# Patient Record
Sex: Male | Born: 1985 | Hispanic: No | Marital: Married | Smoking: Never smoker
Health system: Southern US, Community
[De-identification: ages and names within clinical notes are randomized; demographics above are authoritative.]

## PROBLEM LIST (undated history)

## (undated) DIAGNOSIS — R1032 Left lower quadrant pain: Secondary | ICD-10-CM

## (undated) DIAGNOSIS — R918 Other nonspecific abnormal finding of lung field: Secondary | ICD-10-CM

## (undated) DIAGNOSIS — N5089 Other specified disorders of the male genital organs: Secondary | ICD-10-CM

## (undated) DIAGNOSIS — C7989 Secondary malignant neoplasm of other specified sites: Secondary | ICD-10-CM

---

## 2015-03-12 ENCOUNTER — Encounter (HOSPITAL_COMMUNITY): Payer: Self-pay | Admitting: Emergency Medicine

## 2015-03-12 ENCOUNTER — Emergency Department (INDEPENDENT_AMBULATORY_CARE_PROVIDER_SITE_OTHER): Payer: Self-pay

## 2015-03-12 ENCOUNTER — Emergency Department (INDEPENDENT_AMBULATORY_CARE_PROVIDER_SITE_OTHER)
Admission: EM | Admit: 2015-03-12 | Discharge: 2015-03-12 | Disposition: A | Discharge status: Home / Self Care | Payer: Self-pay | Source: Home / Self Care | Attending: Emergency Medicine | Admitting: Emergency Medicine

## 2015-03-12 DIAGNOSIS — R112 Nausea with vomiting, unspecified: Secondary | ICD-10-CM

## 2015-03-12 DIAGNOSIS — M5442 Lumbago with sciatica, left side: Secondary | ICD-10-CM

## 2015-03-12 DIAGNOSIS — R1084 Generalized abdominal pain: Secondary | ICD-10-CM

## 2015-03-12 DIAGNOSIS — R918 Other nonspecific abnormal finding of lung field: Secondary | ICD-10-CM

## 2015-03-12 LAB — GLUCOSE, CAPILLARY: Glucose-Capillary: 94 mg/dL (ref 65–99)

## 2015-03-12 MED ORDER — ONDANSETRON HCL 4 MG PO TABS: 4.0000 mg | ORAL_TABLET | Freq: Three times a day (TID) | ORAL | Status: DC | PRN

## 2015-03-12 MED ORDER — CIPROFLOXACIN HCL 500 MG PO TABS: 500.0000 mg | ORAL_TABLET | Freq: Two times a day (BID) | ORAL | Status: DC

## 2015-03-12 MED ORDER — PREDNISONE 50 MG PO TABS: ORAL_TABLET | ORAL | Status: DC

## 2015-03-12 MED ORDER — METRONIDAZOLE 500 MG PO TABS: 500.0000 mg | ORAL_TABLET | Freq: Two times a day (BID) | ORAL | Status: DC

## 2015-03-12 NOTE — ED Notes (Signed)
Pt states he has been suffering from abdominal pain and N&V for about 1 week.  He denies any fever.  He reports his last BM was today, it was formed but it wasn't very much.  He also says he has back pain related to his sciatica.  He had same pain four years ago and that is what he was diagnosed with.

## 2015-03-12 NOTE — ED Provider Notes (Signed)
CSN: [id number]     Arrival date & time 03/12/15  1520 History   First MD Initiated Contact with Patient 03/12/15 1544     Chief Complaint  Patient presents with  . Abdominal Pain  . Back Pain  . Nausea  . Emesis   (Consider location/radiation/quality/duration/timing/severity/associated sxs/prior Treatment) HPI  He is a 29 year old man here for evaluation of abdominal pain. He states for the last 2 weeks he has had intermittent abdominal pain, mostly on the left side. It is associated with some nausea, particularly after eating. He has had intermittent vomiting. He has seen a small amount of blood in his vomit. He also reports intermittent diarrhea over the last 2 weeks. No blood in the stool. He denies any foreign travel or questionable food or water sources.  No fevers or chills. He also reports pain in his left lower back with radiation down the left leg. No numbness, tingling, weakness. No bowel or bladder incontinence. He does report a history of sciatica in the left side.  History reviewed. No pertinent past medical history. History reviewed. No pertinent past surgical history. Family History  Problem Relation Age of Onset  . Diabetes Father    Social History  Substance Use Topics  . Smoking status: Never Smoker   . Smokeless tobacco: Never Used  . Alcohol Use: No    Review of Systems As in history of present illness Allergies  Review of patient's allergies indicates no known allergies.  Home Medications   Prior to Admission medications   Medication Sig Start Date End Date Taking? Authorizing Provider  ciprofloxacin (CIPRO) 500 MG tablet Take 1 tablet (500 mg total) by mouth 2 (two) times daily. 03/12/15   Melony Overly, MD  metroNIDAZOLE (FLAGYL) 500 MG tablet Take 1 tablet (500 mg total) by mouth 2 (two) times daily. 03/12/15   Melony Overly, MD  ondansetron (ZOFRAN) 4 MG tablet Take 1 tablet (4 mg total) by mouth every 8 (eight) hours as needed for nausea or vomiting. 03/12/15    Melony Overly, MD  predniSONE (DELTASONE) 50 MG tablet Take 1 pill daily for 5 days. 03/12/15   Melony Overly, MD   Meds Ordered and Administered this Visit  Medications - No data to display  BP 147/73 mmHg  Pulse 85  Temp(Src) 97.6 F (36.4 C) (Oral)  Resp 18  SpO2 98% No data found.   Physical Exam  Constitutional: He appears well-developed and well-nourished. No distress.  Neck: Neck supple.  Cardiovascular: Normal rate, regular rhythm and normal heart sounds.   Pulmonary/Chest: Effort normal and breath sounds normal. No respiratory distress. He has no wheezes. He has no rales.  Abdominal: Soft. Bowel sounds are normal. He exhibits no distension and no mass. There is no tenderness. There is no rebound and no guarding.  Musculoskeletal:  Back: No erythema or edema. No vertebral tenderness or step-offs. No point tenderness, but he points to the left SI area as source of pain. Positive straight leg raise on the left. 5 out of 5 strength in bilateral lower extremities.    ED Course  Procedures (including critical care time)  Labs Review Labs Reviewed  GLUCOSE, CAPILLARY    Imaging Review Dg Abd Acute W/chest  03/12/2015   CLINICAL DATA:  Left lower quadrant abdominal pain.  EXAM: DG ABDOMEN ACUTE W/ 1V CHEST  COMPARISON:  None.  FINDINGS: There is no evidence of dilated bowel loops or free intraperitoneal air. No radiopaque calculi or other significant  radiographic abnormality is seen. Heart size and mediastinal contours are within normal limits. No pneumothorax or pleural effusion is noted. Left lung is clear. Rounded nodules are noted peripherally in the right lung concerning for possible neoplasm or metastatic disease.  IMPRESSION: No evidence of bowel obstruction or ileus. Rounded nodules are noted laterally in the right lung concerning for possible malignancy or metastatic disease. CT scan of the chest is recommended for further evaluation.   Electronically Signed   By: Marijo Conception, M.D.   On: 03/12/2015 17:27      MDM   1. Generalized abdominal pain   2. Non-intractable vomiting with nausea, vomiting of unspecified type   3. Left-sided low back pain with left-sided sciatica   4. Lung nodules    Abdominal exam is benign. This is likely an infectious process. Will treat with Cipro and Flagyl. Zofran as needed for nausea. Back pain is consistent with sciatica. This is likely due to vomiting from the GI process. Treat with 5 days of prednisone. Chest x-ray showed 2 nodules in the right lung. Patient has no smoking history, no personal or family history of smoking. He has no night sweats, fevers, cough, shortness of breath.  Discussed options at length with the patient including transfer to the emergency room for CT scan for additional characterization of the lesions.  Given that he has no risk factors and no insurance, I offered an alternative of a repeat chest x-ray in 3-4 months.  He will return here in 3-4 months for repeat x-ray. If he develops any respiratory symptoms, weight loss, fever he will go to the emergency room for immediate evaluation.    Melony Overly, MD 03/12/15 [phone]

## 2015-03-12 NOTE — Discharge Instructions (Signed)
You likely have an infection in your intestines. Take Cipro and Flagyl twice a day for the next 10 days.  Take prednisone daily for the next 5 days for the back pain.  You should start to feel better over the weekend. If you are not getting better, please come back.  Your x-ray today showed 2 nodules in your right lung.  We discussed options. Please come back here in 3-4 months to repeat the x-ray. If you start developing weight loss, cough, shortness of breath, please go to the emergency room.

## 2015-03-16 ENCOUNTER — Encounter (HOSPITAL_COMMUNITY): Payer: Self-pay | Admitting: Emergency Medicine

## 2015-03-16 ENCOUNTER — Emergency Department (HOSPITAL_COMMUNITY)
Admission: EM | Admit: 2015-03-16 | Discharge: 2015-03-16 | Discharge status: Against Medical Advice | Payer: Self-pay | Attending: Emergency Medicine | Admitting: Emergency Medicine

## 2015-03-16 ENCOUNTER — Emergency Department (INDEPENDENT_AMBULATORY_CARE_PROVIDER_SITE_OTHER)
Admission: EM | Admit: 2015-03-16 | Discharge: 2015-03-16 | Disposition: A | Discharge status: Nursing Home (Includes ICF) | Payer: Self-pay | Source: Home / Self Care | Attending: Family Medicine | Admitting: Family Medicine

## 2015-03-16 DIAGNOSIS — K659 Peritonitis, unspecified: Secondary | ICD-10-CM

## 2015-03-16 DIAGNOSIS — R1012 Left upper quadrant pain: Secondary | ICD-10-CM

## 2015-03-16 DIAGNOSIS — R109 Unspecified abdominal pain: Secondary | ICD-10-CM | POA: Insufficient documentation

## 2015-03-16 LAB — LIPASE, BLOOD: Lipase: 27 U/L (ref 22–51)

## 2015-03-16 LAB — COMPREHENSIVE METABOLIC PANEL
ALBUMIN: 3.7 g/dL (ref 3.5–5.0)
ALK PHOS: 154 U/L — AB (ref 38–126)
ALT: 35 U/L (ref 17–63)
ANION GAP: 8 (ref 5–15)
AST: 20 U/L (ref 15–41)
BUN: 16 mg/dL (ref 6–20)
CALCIUM: 9.1 mg/dL (ref 8.9–10.3)
CO2: 24 mmol/L (ref 22–32)
Chloride: 105 mmol/L (ref 101–111)
Creatinine, Ser: 1.22 mg/dL (ref 0.61–1.24)
GFR calc Af Amer: >60 mL/min (ref >60)
GFR calc non Af Amer: >60 mL/min (ref >60)
GLUCOSE: 103 mg/dL — AB (ref 65–99)
Potassium: 3.4 mmol/L — ABNORMAL LOW (ref 3.5–5.1)
SODIUM: 137 mmol/L (ref 135–145)
Total Bilirubin: 0.8 mg/dL (ref 0.3–1.2)
Total Protein: 7.5 g/dL (ref 6.5–8.1)

## 2015-03-16 LAB — CBC
HCT: 35.6 % — ABNORMAL LOW (ref 39.0–52.0)
HEMOGLOBIN: 11.9 g/dL — AB (ref 13.0–17.0)
MCH: 26.4 pg (ref 26.0–34.0)
MCHC: 33.4 g/dL (ref 30.0–36.0)
MCV: 79.1 fL (ref 78.0–100.0)
Platelets: 260 10*3/uL (ref 150–400)
RBC: 4.5 MIL/uL (ref 4.22–5.81)
RDW: 12.3 % (ref 11.5–15.5)
WBC: 11.3 10*3/uL — ABNORMAL HIGH (ref 4.0–10.5)

## 2015-03-16 NOTE — ED Notes (Signed)
Pt sts abd pain and sent from [facility] for further eval; pt sts pain intermittent x 3 weeks and was put on antibiotics without relief

## 2015-03-16 NOTE — ED Notes (Signed)
C/o abd pain Was seen here for same sx States he feels bloated, sob, nausea and diarrhea Received antibiotics from [facility]

## 2015-03-16 NOTE — ED Provider Notes (Signed)
CSN: [id number]     Arrival date & time 03/16/15  1449 History   First MD Initiated Contact with Patient 03/16/15 [phone]     Chief Complaint  Patient presents with  . Abdominal Pain   (Consider location/radiation/quality/duration/timing/severity/associated sxs/prior Treatment) Patient is a 29 y.o. male presenting with abdominal pain. The history is provided by the patient and the spouse.  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 Patient describes 3 weeks of progressive epigastric pain. He was seen 4 days ago and prescribed antibiotic here. The pain is only worsened. He says the pain is worse every other day and today is that day. He has vomited with this. He notes that moving suddenly, coughing, hitting a bump on the road provokes significant pain in the epigastric region.  History reviewed. No pertinent past medical history. History reviewed. No pertinent past surgical history. Family History  Problem Relation Age of Onset  . Diabetes Father    Social History  Substance Use Topics  . Smoking status: Never Smoker   . Smokeless tobacco: Never Used  . Alcohol Use: No    Review of Systems  Constitutional: Positive for fever, diaphoresis and appetite change.  HENT: Negative.   Eyes: Negative.   Respiratory: Negative.   Cardiovascular: Negative.   Gastrointestinal: Positive for nausea, vomiting, abdominal pain and abdominal distention. Negative for blood in stool.  Musculoskeletal: Negative.   Skin: Negative.     Allergies  Review of patient's allergies indicates no known allergies.  Home Medications   Prior to Admission medications   Medication Sig Start Date End Date Taking? Authorizing Provider  ciprofloxacin  (CIPRO) 500 MG tablet Take 1 tablet (500 mg total) by mouth 2 (two) times daily. 03/12/15   Melony Overly, MD  metroNIDAZOLE (FLAGYL) 500 MG tablet Take 1 tablet (500 mg total) by mouth 2 (two) times daily. 03/12/15   Melony Overly, MD  ondansetron (ZOFRAN) 4 MG tablet Take 1 tablet (4 mg total) by mouth every 8 (eight) hours as needed for nausea or vomiting. 03/12/15   Melony Overly, MD  predniSONE (DELTASONE) 50 MG tablet Take 1 pill daily for 5 days. 03/12/15   Melony Overly, MD   Meds Ordered and Administered this Visit  Medications - No data to display  BP 135/92 mmHg  Pulse 92  Temp(Src) 99.2 F (37.3 C) (Oral)  Resp 16  SpO2 99% No data found.   Physical Exam  Constitutional: He is oriented to person, place, and time. He appears well-developed and well-nourished.  HENT:  Head: Normocephalic and atraumatic.  Right Ear: External ear normal.  Left Ear: External ear normal.  Mouth/Throat: Oropharynx is clear and moist.  Eyes: Conjunctivae are normal. Pupils are equal, round, and reactive to light.  Neck: Normal range of motion. Neck supple.  Cardiovascular: Normal rate, regular rhythm and normal heart sounds.   Pulmonary/Chest: Effort normal and breath sounds normal.  Abdominal:  Patient is able to lie down slowly from a sitting position. He has guarding and rebound in the left upper quadrant with a fullness which is difficult to distinguish from muscle spasm. He appears to have a firm mass in that area but it may be muscle spasm.  Neurological: He  is alert and oriented to person, place, and time.  Skin: Skin is warm and dry.  Psychiatric: His behavior is normal. Judgment and thought content normal.    ED Course  Procedures (including critical care time)  Labs Review Labs Reviewed - No data to display  Imaging Review No results found.      MDM  Assessment: Patient has peritoneal signs left upper quadrant. I'm not sure if this is an ulcer since he's been taking quite a bit of  ibuprofen for his low back pain. He's not a drinker so PEG otitis is less likely but is also in the differential.  Plan: Transfer to ED for further evaluation  Signed, Robyn Haber M.D.    Robyn Haber, MD 03/16/15 [phone]

## 2015-03-16 NOTE — ED Notes (Signed)
The patient said he was coming back in the morning that he was not waiting any longer.  The patient was able to walk out with no problems.

## 2015-03-17 ENCOUNTER — Observation Stay (HOSPITAL_COMMUNITY)
Admission: EM | Admit: 2015-03-17 | Discharge: 2015-03-17 | Disposition: A | Discharge status: Home / Self Care | Payer: Self-pay | Attending: Oncology | Admitting: Oncology

## 2015-03-17 ENCOUNTER — Encounter (HOSPITAL_COMMUNITY): Payer: Self-pay | Admitting: Emergency Medicine

## 2015-03-17 ENCOUNTER — Observation Stay (HOSPITAL_COMMUNITY): Payer: Self-pay

## 2015-03-17 ENCOUNTER — Emergency Department (HOSPITAL_COMMUNITY): Payer: Self-pay

## 2015-03-17 ENCOUNTER — Telehealth: Payer: Self-pay | Admitting: Oncology

## 2015-03-17 DIAGNOSIS — C7989 Secondary malignant neoplasm of other specified sites: Secondary | ICD-10-CM | POA: Diagnosis present

## 2015-03-17 DIAGNOSIS — R590 Localized enlarged lymph nodes: Secondary | ICD-10-CM | POA: Insufficient documentation

## 2015-03-17 DIAGNOSIS — R197 Diarrhea, unspecified: Secondary | ICD-10-CM | POA: Insufficient documentation

## 2015-03-17 DIAGNOSIS — R112 Nausea with vomiting, unspecified: Secondary | ICD-10-CM | POA: Insufficient documentation

## 2015-03-17 DIAGNOSIS — N1330 Unspecified hydronephrosis: Secondary | ICD-10-CM | POA: Insufficient documentation

## 2015-03-17 DIAGNOSIS — E876 Hypokalemia: Secondary | ICD-10-CM | POA: Insufficient documentation

## 2015-03-17 DIAGNOSIS — C7800 Secondary malignant neoplasm of unspecified lung: Secondary | ICD-10-CM | POA: Insufficient documentation

## 2015-03-17 DIAGNOSIS — C787 Secondary malignant neoplasm of liver and intrahepatic bile duct: Secondary | ICD-10-CM | POA: Insufficient documentation

## 2015-03-17 DIAGNOSIS — R140 Abdominal distension (gaseous): Secondary | ICD-10-CM | POA: Insufficient documentation

## 2015-03-17 DIAGNOSIS — E299 Testicular dysfunction, unspecified: Secondary | ICD-10-CM

## 2015-03-17 DIAGNOSIS — M545 Low back pain: Secondary | ICD-10-CM | POA: Insufficient documentation

## 2015-03-17 DIAGNOSIS — C799 Secondary malignant neoplasm of unspecified site: Secondary | ICD-10-CM | POA: Diagnosis present

## 2015-03-17 DIAGNOSIS — N62 Hypertrophy of breast: Secondary | ICD-10-CM | POA: Insufficient documentation

## 2015-03-17 DIAGNOSIS — C6292 Malignant neoplasm of left testis, unspecified whether descended or undescended: Principal | ICD-10-CM | POA: Insufficient documentation

## 2015-03-17 DIAGNOSIS — R1031 Right lower quadrant pain: Secondary | ICD-10-CM | POA: Insufficient documentation

## 2015-03-17 DIAGNOSIS — N5089 Other specified disorders of the male genital organs: Secondary | ICD-10-CM | POA: Insufficient documentation

## 2015-03-17 HISTORY — DX: Secondary malignant neoplasm of other specified sites: C79.89

## 2015-03-17 LAB — COMPREHENSIVE METABOLIC PANEL
ALT: 32 U/L (ref 17–63)
ANION GAP: 8 (ref 5–15)
AST: 22 U/L (ref 15–41)
Albumin: 3.3 g/dL — ABNORMAL LOW (ref 3.5–5.0)
Alkaline Phosphatase: 130 U/L — ABNORMAL HIGH (ref 38–126)
BUN: 16 mg/dL (ref 6–20)
CHLORIDE: 104 mmol/L (ref 101–111)
CO2: 23 mmol/L (ref 22–32)
Calcium: 8.6 mg/dL — ABNORMAL LOW (ref 8.9–10.3)
Creatinine, Ser: 1.33 mg/dL — ABNORMAL HIGH (ref 0.61–1.24)
Glucose, Bld: 154 mg/dL — ABNORMAL HIGH (ref 65–99)
POTASSIUM: 3.2 mmol/L — AB (ref 3.5–5.1)
SODIUM: 135 mmol/L (ref 135–145)
Total Bilirubin: 1.1 mg/dL (ref 0.3–1.2)
Total Protein: 6.8 g/dL (ref 6.5–8.1)

## 2015-03-17 LAB — CBC WITH DIFFERENTIAL/PLATELET
Basophils Absolute: 0 10*3/uL (ref 0.0–0.1)
Basophils Relative: 0 %
EOS ABS: 0.1 10*3/uL (ref 0.0–0.7)
EOS PCT: 1 %
HCT: 32.3 % — ABNORMAL LOW (ref 39.0–52.0)
Hemoglobin: 11 g/dL — ABNORMAL LOW (ref 13.0–17.0)
LYMPHS ABS: 2.3 10*3/uL (ref 0.7–4.0)
Lymphocytes Relative: 27 %
MCH: 27 pg (ref 26.0–34.0)
MCHC: 34.1 g/dL (ref 30.0–36.0)
MCV: 79.4 fL (ref 78.0–100.0)
MONO ABS: 0.8 10*3/uL (ref 0.1–1.0)
MONOS PCT: 9 %
Neutro Abs: 5.6 10*3/uL (ref 1.7–7.7)
Neutrophils Relative %: 63 %
PLATELETS: 192 10*3/uL (ref 150–400)
RBC: 4.07 MIL/uL — AB (ref 4.22–5.81)
RDW: 12.5 % (ref 11.5–15.5)
WBC: 8.8 10*3/uL (ref 4.0–10.5)

## 2015-03-17 LAB — LIPASE, BLOOD: Lipase: 31 U/L (ref 22–51)

## 2015-03-17 LAB — LACTATE DEHYDROGENASE: LDH: 406 U/L — ABNORMAL HIGH (ref 98–192)

## 2015-03-17 MED ORDER — ONDANSETRON HCL 4 MG PO TABS: 4.0000 mg | ORAL_TABLET | Freq: Three times a day (TID) | ORAL | Status: AC | PRN

## 2015-03-17 MED ORDER — IOHEXOL 300 MG/ML  SOLN
25.0000 mL | Freq: Once | INTRAMUSCULAR | Status: AC | PRN
  Administered 2015-03-17: 25 mL via ORAL

## 2015-03-17 MED ORDER — IOHEXOL 300 MG/ML  SOLN
100.0000 mL | Freq: Once | INTRAMUSCULAR | Status: AC | PRN
  Administered 2015-03-17: 100 mL via INTRAVENOUS

## 2015-03-17 MED ORDER — POTASSIUM CHLORIDE CRYS ER 20 MEQ PO TBCR: 40.0000 meq | EXTENDED_RELEASE_TABLET | Freq: Once | ORAL | Status: DC

## 2015-03-17 MED ORDER — OXYCODONE HCL 5 MG PO TABS: 5.0000 mg | ORAL_TABLET | ORAL | Status: AC | PRN

## 2015-03-17 NOTE — ED Notes (Signed)
Pt is in good condition upon d/c. Pt was admitted to hospital, but refuses to stay and was given resources for outpatient tx.

## 2015-03-17 NOTE — ED Notes (Signed)
Pt reports generalized abd pain with bloating and n/v/d and fever; pt also report lower back pain; pt reports was seen at [facility] on Friday and was sent here for further evaluation;

## 2015-03-17 NOTE — Discharge Instructions (Signed)
Dr Alen Blew will be your Oncologist, Dr Matilde Sprang is the Urologist on call today, we have notified both of them of your case and they will contact you for follow up.  If you do not hear from them please call their office or our office at [phone] and ask for Dr Naaman Plummer

## 2015-03-17 NOTE — ED Provider Notes (Signed)
CSN: [id number]     Arrival date & time 03/17/15  0542 History   First MD Initiated Contact with Patient 03/17/15 0606     Chief Complaint  Patient presents with  . Abdominal Pain  . Flank Pain     (Consider location/radiation/quality/duration/timing/severity/associated sxs/prior Treatment) HPI Comments: Patient is a 29 year old male with no past medical history who presents with abdominal pain for the past 3 weeks. The pain is located in the LUQ and radiates to his epigastric area. The pain is described as pressure and aching and severe. The pain started gradually and progressively worsened since the onset. Patient reports the pain is exacerbated with movement. No alleviating factors. The patient has tried Cipro and Flagyl prescribed at Urgent Care for symptoms without relief. Associated symptoms include persistent nausea with occasional diarrhea and vomiting. Patient denies fever, headache, chest pain, SOB, dysuria, constipation. No history of abdominal surgery. Patient has been seen at Urgent Care multiple times and instructed to come to the ED for further evaluation.    History reviewed. No pertinent past medical history. History reviewed. No pertinent past surgical history. Family History  Problem Relation Age of Onset  . Diabetes Father    Social History  Substance Use Topics  . Smoking status: Never Smoker   . Smokeless tobacco: Never Used  . Alcohol Use: No    Review of Systems  Gastrointestinal: Positive for nausea, vomiting, abdominal pain and diarrhea.  All other systems reviewed and are negative.     Allergies  Review of patient's allergies indicates no known allergies.  Home Medications   Prior to Admission medications   Medication Sig Start Date End Date Taking? Authorizing Provider  ciprofloxacin (CIPRO) 500 MG tablet Take 1 tablet (500 mg total) by mouth 2 (two) times daily. 03/12/15  Yes Melony Overly, MD  ibuprofen (ADVIL,MOTRIN) 200 MG tablet Take 200-400 mg  by mouth every 6 (six) hours as needed for mild pain or moderate pain.   Yes Historical Provider, MD  metroNIDAZOLE (FLAGYL) 500 MG tablet Take 1 tablet (500 mg total) by mouth 2 (two) times daily. 03/12/15  Yes Melony Overly, MD  ondansetron (ZOFRAN) 4 MG tablet Take 1 tablet (4 mg total) by mouth every 8 (eight) hours as needed for nausea or vomiting. 03/12/15  Yes Melony Overly, MD  predniSONE (DELTASONE) 50 MG tablet Take 1 pill daily for 5 days. 03/12/15  Yes Melony Overly, MD   BP 135/77 mmHg  Pulse 69  Temp(Src) 98.5 F (36.9 C) (Oral)  Resp 18  SpO2 100% Physical Exam  Constitutional: He is oriented to person, place, and time. He appears well-developed and well-nourished. No distress.  HENT:  Head: Normocephalic and atraumatic.  Eyes: Conjunctivae and EOM are normal.  Neck: Normal range of motion.  Cardiovascular: Normal rate and regular rhythm.  Exam reveals no gallop and no friction rub.   No murmur heard. Pulmonary/Chest: Effort normal and breath sounds normal. He has no wheezes. He has no rales. He exhibits no tenderness.  Abdominal: Soft. He exhibits no distension. There is tenderness. There is no rebound.  LUQ tenderness to palpation. No other focal tenderness or peritoneal signs.   Musculoskeletal: Normal range of motion.  Neurological: He is alert and oriented to person, place, and time.  Speech is goal-oriented. Moves limbs without ataxia.   Skin: Skin is warm and dry.  Psychiatric: He has a normal mood and affect. His behavior is normal.  Nursing note and vitals  reviewed.   ED Course  Procedures (including critical care time) Labs Review Labs Reviewed  CBC WITH DIFFERENTIAL/PLATELET - Abnormal; Notable for the following:    RBC 4.07 (*)    Hemoglobin 11.0 (*)    HCT 32.3 (*)    All other components within normal limits  COMPREHENSIVE METABOLIC PANEL - Abnormal; Notable for the following:    Potassium 3.2 (*)    Glucose, Bld 154 (*)    Creatinine, Ser 1.33 (*)     Calcium 8.6 (*)    Albumin 3.3 (*)    Alkaline Phosphatase 130 (*)    All other components within normal limits  AFP TUMOR MARKER - Abnormal; Notable for the following:    AFP-Tumor Marker 1527.0 (*)    All other components within normal limits  BETA HCG, QUANT (TUMOR MARKER) - Abnormal; Notable for the following:    Beta hCG, Tumor Marker [phone] (*)    All other components within normal limits  LACTATE DEHYDROGENASE - Abnormal; Notable for the following:    LDH 406 (*)    All other components within normal limits  LIPASE, BLOOD  HIV ANTIBODY (ROUTINE TESTING)    Imaging Review No results found. I have personally reviewed and evaluated these images and lab results as part of my medical decision-making.   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 Vitals stable and patient afebrile. Patient will have CT abdomen pelvis. Patient declines pain medication at this time.   CT shows metastatic carcinoma with a primary mass of the left testicle. I consulted internal medicine who was going to admit the patient, however, he wants to go home and be with his wife and 38 year old child. Internal medicine service was able to schedule outpatient appointments to initiate patient's course of treatment.   Alvina Chou, PA-C 03/20/15 [phone]  Rolland Porter, MD 03/22/15 [phone]

## 2015-03-17 NOTE — Telephone Encounter (Signed)
New patient appt-s/w patient and gave np appt for 09/20 @ 1:45 w/Dr. Alen Blew.

## 2015-03-17 NOTE — Progress Notes (Signed)
[facility] for [facility] [phone]  Spoke to patient regarding primary care resources and the [facility] orange card. [facility] orange card application provided and explained, pt instructed to contact me once application was complete for an appointment. Resource guide and my contact information also provided for any future questions or concerns. No other [place] [facility] needs identified at this time.

## 2015-03-17 NOTE — ED Provider Notes (Signed)
Patient reports he has been having left upper quadrant and right lower flank pain for the past 3 weeks. He states sitting in the car for. At time or walking or bending over makes the pain worse. He has had some nausea and vomiting. He states he has lost 20 pounds in the past 3 weeks. He denies any night sweats. He denies any injury.  Patient is well-developed well-nourished. On abdominal exam he is tender in the left upper quadrant. I am unable to appreciate a mass. He also has some tenderness in the inferior right flank area. I do not feel any supraclavicular or epitrochlear lymph nodes.  Medical screening examination/treatment/procedure(s) were conducted as a shared visit with non-physician practitioner(s) and myself.  I personally evaluated the patient during the encounter.   EKG Interpretation None       Rolland Porter, MD, Barbette Or, MD 03/17/15 [phone]

## 2015-03-17 NOTE — Consult Note (Signed)
Date: 03/17/2015               Patient Name:  Michael Woodard MRN: [id number]  DOB: March 11, 1986 Age / Sex: 29 y.o., male   PCP: No Pcp Per Patient         Requesting Physician: Dr. Tomi Bamberger          Date: 03/17/2015                                   Medical Service: Internal Medicine Teaching Service         Attending Physician: Dr. Annia Belt, MD    First Contact: Dr. Maryellen Pile Pager: [phone]  Second Contact: Dr. Irven Easterly Pager: [phone]       After Hours (After 5p/  First Contact Pager: [phone]  weekends / holidays): Second Contact Pager: [phone]   Chief Complaint: abdominal pain  History of Present Illness: Michael Woodard is a 29 year old [place]anian male without significant PMH who presents to [facility] for CC of abdominal pain. He notes that his abdominal pain started roughly 3 weeks ago and has been associated with back pain, left "sciatic pain," and weight loss. It has progressed in severity over the past 3 weeks and more recently he has begun to have nausea/ vomiting and diarrhea. He was seen for these complaints 5 days ago at urgent care and at that time also had a chest xray that was concerning for pulmonary nodules. He declined transfer at that time to the emergency department for further imagining but agreed to return if symptoms worsened. He was diagnosed empirically with colitis and radiculopathy and prescribed a course of cipro/flagyl and prednisone. The prednisone has helped his back pain some but overall he still feels very unwell and returned as directed. 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 As he has no PCP and no clear plans in place for follow up IMTS was  called by the EDP to overnight admission to finish workup and establish follow up. However upon being told of the findings the patient would like to return home to discuss with his wife, as well as his sister who is a pediatrician in [place].  Meds: No current facility-administered medications for this encounter.   Current Outpatient Prescriptions  Medication Sig Dispense Refill  . ciprofloxacin (CIPRO) 500 MG tablet Take 1 tablet (500 mg total) by mouth 2 (two) times daily. 20 tablet 0  . ibuprofen (ADVIL,MOTRIN) 200 MG tablet Take 200-400 mg by mouth every 6 (six) hours as needed for mild pain or moderate pain.    . metroNIDAZOLE (FLAGYL) 500 MG tablet Take 1 tablet (500 mg total) by mouth 2 (two) times daily. 20 tablet 0  . ondansetron (ZOFRAN) 4 MG tablet Take 1 tablet (4 mg total) by mouth every 8 (eight) hours as needed for nausea or vomiting. 20 tablet 0  . predniSONE (DELTASONE) 50 MG tablet Take 1 pill daily for 5 days. 5 tablet 0    Allergies: Allergies as of 03/17/2015  . (No Known Allergies)   Past Medical History  Diagnosis Date  . 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 No pertinent past surgical history. Family History  Problem Relation Age of Onset  . Diabetes Father    Social History   Social History  . Marital Status: Married    Spouse Name: N/A  . Number of Children: N/A  . Years of Education: N/A   Occupational History  . Not on file.   Social History Main Topics  . Smoking status: Never Smoker   . Smokeless tobacco: Never Used  . Alcohol Use: No  . Drug Use: No  . Sexual Activity: Not on file   Other Topics Concern  . Not on file   Social History Narrative   Moved to [place] in 2014, currently a Ship broker at [employer]. Lives with wife and 31 year old son.    Review of Systems: Review of Systems    Constitutional: Positive for weight loss. Negative for fever and chills.   Denies night sweats  Eyes: Negative for blurred vision.  Respiratory: Negative for cough, hemoptysis, shortness of breath and wheezing.  Cardiovascular: Negative for chest pain and leg swelling.  Gastrointestinal: Positive for nausea, vomiting, abdominal pain and diarrhea. Negative for heartburn, constipation, blood in stool and melena.  Genitourinary: Positive for flank pain (left). Negative for dysuria.  Musculoskeletal: Positive for back pain. Negative for joint pain.  Neurological: Negative for dizziness, sensory change, focal weakness and headaches.     Physical Exam: Blood pressure 128/78, pulse 64, temperature 98.5 F (36.9 C), temperature source Oral, resp. rate 18, SpO2 99 %. Physical Exam  Constitutional: He is well-developed, well-nourished, and in no distress.  HENT:  Head: Normocephalic and atraumatic.  Eyes: Conjunctivae are normal.  Cardiovascular: Normal rate, regular rhythm and normal heart sounds.  No murmur heard. Pulmonary/Chest: Effort normal and breath sounds normal. No respiratory distress. He has no wheezes. He has no rales.  Abdominal: Soft. Bowel sounds are normal.  He does have left upper quadrant tenderness, upon deep palpation I am able to appreciate a firmness but unable to fully evaluate due to patient discomfort.  Genitourinary:  Right testicular exam normal, his left testicle has a nodule on the inferior posterior portion, I do not appreciate any irregularity.  Musculoskeletal: He exhibits no edema.  Straight leg raise positive on left  Nursing note and vitals reviewed.    Lab results: Basic Metabolic Panel:  Recent Labs (last 2 labs)      Recent Labs  03/16/15 1733 03/17/15 0639  NA 137 135  K 3.4* 3.2*  CL 105 104  CO2 24 23  GLUCOSE 103* 154*  BUN 16 16  CREATININE 1.22 1.33*  CALCIUM 9.1 8.6*     Liver Function Tests:   Recent Labs (last 2 labs)      Recent Labs  03/16/15 1733 03/17/15 0639  AST 20 22  ALT 35 32  ALKPHOS 154* 130*  BILITOT 0.8 1.1  PROT 7.5 6.8  ALBUMIN 3.7 3.3*      Recent Labs (last 2 labs)      Recent Labs  03/16/15 1733 03/17/15 0639  LIPASE 27 31      Recent Labs (last 2 labs)     No results for input(s): AMMONIA in the last 72 hours.   CBC:  Recent Labs (last 2 labs)      Recent Labs  03/16/15 1733 03/17/15 0639  WBC 11.3* 8.8  NEUTROABS --  5.6  HGB 11.9* 11.0*  HCT 35.6* 32.3*  MCV 79.1 79.4  PLT 260 192      Imaging results:   Imaging Results (Last  48 hours)    Ct Abdomen Pelvis W Contrast  03/17/2015 ADDENDUM REPORT: 03/17/2015 09:30 ADDENDUM: Not mentioned in the impression section is a 12 mm caudate lobe lesion. This could either be re-evaluated at followup or more entirely characterized with pre and post contrast outpatient abdominal MRI. If the patient is going to undergo PET, this would also likely be informative. Electronically Signed By: Abigail Miyamoto M.D. On: 03/17/2015 09:30  03/17/2015 CLINICAL DATA: Generalized abdominal pain with bloating, nausea vomiting and diarrhea. Low back pain. EXAM: CT ABDOMEN AND PELVIS WITH CONTRAST TECHNIQUE: Multidetector CT imaging of the abdomen and pelvis was performed using the standard protocol following bolus administration of intravenous contrast. CONTRAST: 155mL OMNIPAQUE IOHEXOL 300 MG/ML SOLN COMPARISON: Plain films of 03/12/2015 FINDINGS: Lower chest: Bibasilar pulmonary nodules/metastasis. 1.6 cm left lower lobe nodule on image 9 of series 3. Bilateral gynecomastia. Normal heart size without pericardial or pleural effusion. Hepatobiliary: Mildly enlarged caudate lobe. posterior lateral segment left liver lobe 12 mm low-density lesion on image 25 of series 2. Normal gallbladder, without biliary ductal dilatation. Pancreas: Normal, without mass  or ductal dilatation. Spleen: Normal in size, without focal abnormality. Adrenals/Urinary Tract: Normal adrenal glands. 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 Moderate left-sided hydronephrosis with decreased left renal function. This is secondary to retroperitoneal mass detailed below. Distal ureters are normal in caliber. Normal urinary bladder. Stomach/Bowel: Normal stomach, without wall thickening. Normal colon, appendix, and terminal ileum. Normal small bowel. Vascular/Lymphatic: Normal caliber of the aorta and branch vessels. Retroperitoneal heterogeneous mass is likely a nodal conglomerate. 9.4 x 11.9 cm, including on image 51 of series 2. This extends into the aortocaval space. There is also bilateral retrocrural adenopathy, including a heterogeneous 3.2 x 5.0 cm mass on image 36 of series 2. Left external iliac adenopathy at 1.5 cm on image 94 of series 2 Reproductive: Normal prostate. left testicular hypo attenuating nodule measures 9 mm on image 121 of series 2. Other: No significant free fluid. Musculoskeletal: No acute osseous abnormality. Congenitally short lumbar pedicles. IMPRESSION: 1. Extensive metastatic disease, including pulmonary nodules, heterogeneous retroperitoneal adenopathy, retrocrural adenopathy and left pelvic sidewall adenopathy. The distribution is most consistent with A left testicular primary. Retroperitoneal sarcoma could look similar but is felt less likely. 2. 9 mm left testicular lesion could represent the primary. Recommend correlation with testicular ultrasound. 3. Left-sided hydronephrosis and decreased renal function, secondary to the dominant retroperitoneal mass. 4. Bilateral gynecomastia. These results were called by telephone at the time of interpretation on 03/17/2015 at 9:03 am to Dr. Alvina Chou , who verbally acknowledged these results. Electronically Signed: By: Abigail Miyamoto M.D. On: 03/17/2015 09:05     Other results: EKG:  none  Assessment & Plan by Problem: 29 year old male who presents with N/V/D,back and abdominal pain with imaging concerning for metastatic carcinoma.   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 Given that he is a young male with these findings we have a very high degree of suspicion of a testicular primary. 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 - Testicular ultrasound - Check AFP, bHCG, LDH - Patient wants to go leave today and not be admitted overnight, will see if we can arrange appropriate follow up today    Hydronephrosis of left kidney - 2/2 obstruction from retroperitoneal adenopathy, will likely need urology involvement for possible stent.   Testicular mass - Testicular ultrasound   Gynecomastia, male - Checking  bHCG for possible GCT   Hypokalemia - Likely secondary to nausea and vomiting   Dispo: Will arrange follow up with [facility] clinic  The patient does not have a current PCP (No Pcp Per Patient) and does need an [facility] hospital follow-up appointment after discharge.  The patient does not have transportation limitations that hinder transportation to clinic appointments.  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 LDH is elevated, other labs are pending. Dr Beryle Beams spoke with oncology>> Dr Alen Blew and with urology >>Dr MacDiarmid  Both will call him with follow up plans. Will allow patient to be discharged home per his wishes -Rx Zofran for nausea -Rx oxycodone for pain -he may follow up and establish with Dr Naaman Plummer in the [facility] for PCP  Lucious Groves, DO

## 2015-03-17 NOTE — ED Notes (Signed)
Patient transported to Ultrasound 

## 2015-03-17 NOTE — ED Notes (Signed)
To [place] for scrotum ultrasound

## 2015-03-18 LAB — HIV ANTIBODY (ROUTINE TESTING W REFLEX): HIV SCREEN 4TH GENERATION: NONREACTIVE

## 2015-03-18 LAB — AFP TUMOR MARKER: AFP TUMOR MARKER: 1527 ng/mL — AB (ref 0.0–8.3)

## 2015-03-18 LAB — BETA HCG QUANT (REF LAB): BETA HCG, TUMOR MARKER: 257872 m[IU]/mL — AB (ref 0–3)

## 2015-03-22 ENCOUNTER — Encounter (HOSPITAL_BASED_OUTPATIENT_CLINIC_OR_DEPARTMENT_OTHER): Payer: Self-pay | Admitting: *Deleted

## 2015-03-22 ENCOUNTER — Other Ambulatory Visit: Payer: Self-pay | Admitting: Urology

## 2015-03-23 ENCOUNTER — Ambulatory Visit: Payer: Self-pay | Admitting: Oncology

## 2015-03-23 ENCOUNTER — Other Ambulatory Visit: Payer: Self-pay

## 2015-03-23 ENCOUNTER — Telehealth: Payer: Self-pay | Admitting: Oncology

## 2015-03-23 NOTE — Telephone Encounter (Signed)
CALLED PT TO R/S NP APPT FOR 09/21 @ 1:30 W/DR. SHADAD

## 2015-03-24 ENCOUNTER — Ambulatory Visit: Payer: Self-pay | Admitting: Oncology

## 2015-03-31 ENCOUNTER — Ambulatory Visit (HOSPITAL_BASED_OUTPATIENT_CLINIC_OR_DEPARTMENT_OTHER): Admission: RE | Admit: 2015-03-31 | Payer: MEDICAID | Source: Ambulatory Visit | Admitting: Urology

## 2015-03-31 ENCOUNTER — Encounter (HOSPITAL_BASED_OUTPATIENT_CLINIC_OR_DEPARTMENT_OTHER): Admission: RE | Payer: Self-pay | Source: Ambulatory Visit

## 2015-03-31 HISTORY — DX: Other specified disorders of the male genital organs: N50.89

## 2015-03-31 HISTORY — DX: Other nonspecific abnormal finding of lung field: R91.8

## 2015-03-31 HISTORY — DX: Left lower quadrant pain: R10.32

## 2015-03-31 SURGERY — ORCHIECTOMY
Anesthesia: General | Site: Groin | Laterality: Left

## 2016-01-09 IMAGING — CT CT ABD-PELV W/ CM
2 of 5 series · 15 of 46 positions shown, 17 images · IV contrast (Omni 300)
Comparison: Plain films of 03/12/2015

ADDENDUM:
Not mentioned in the impression section is a 12 mm caudate lobe
lesion. This could either be re-evaluated at followup or more
entirely characterized with pre and post contrast outpatient
abdominal MRI. If the patient is going to undergo PET, this would
also likely be informative.
CLINICAL DATA: Generalized abdominal pain with bloating, nausea
vomiting and diarrhea. Low back pain.

EXAM:
CT ABDOMEN AND PELVIS WITH CONTRAST
TECHNIQUE: Multidetector CT imaging of the abdomen and pelvis was performed
using the standard protocol following bolus administration of
intravenous contrast.
CONTRAST:  100mL OMNIPAQUE IOHEXOL 300 MG/ML  SOLN

[Series 2: abd/ pelvis 5.0 i30f 1 · axial · 0.87mm/px · z∈[-609,-64]mm · 12 of 123 slices shown, 14 images]
[im 7/123  soft-tissue]
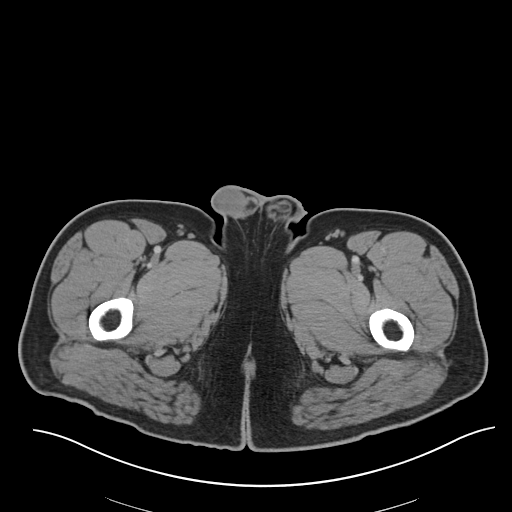
[im 7/123  bone]
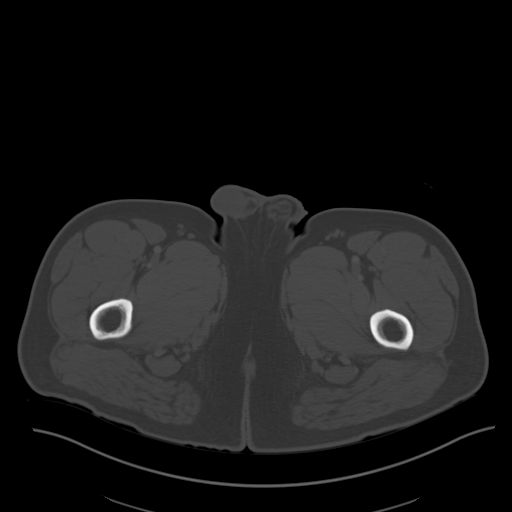
[im 21/123  soft-tissue]
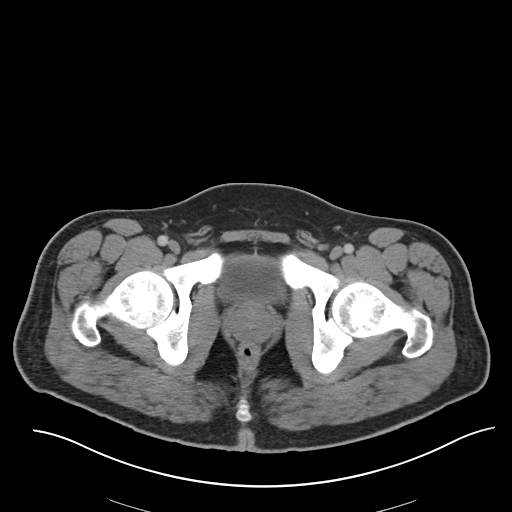
[im 28/123  soft-tissue]
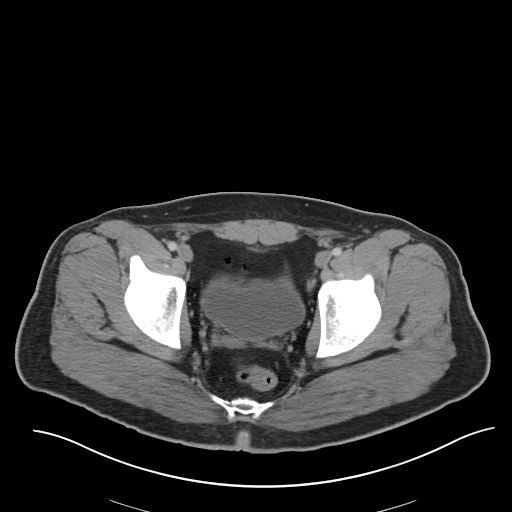
[im 34/123  soft-tissue]
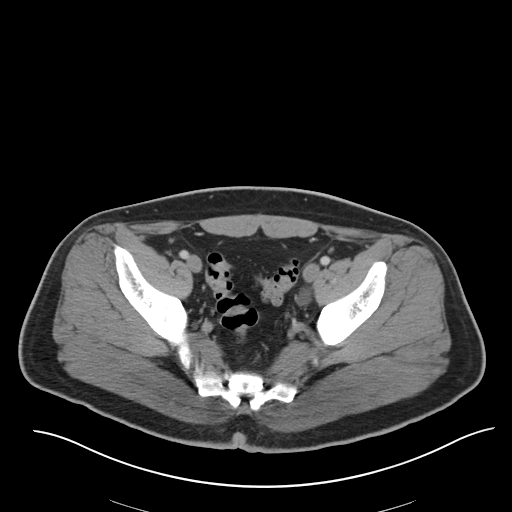
[im 48/123  soft-tissue]
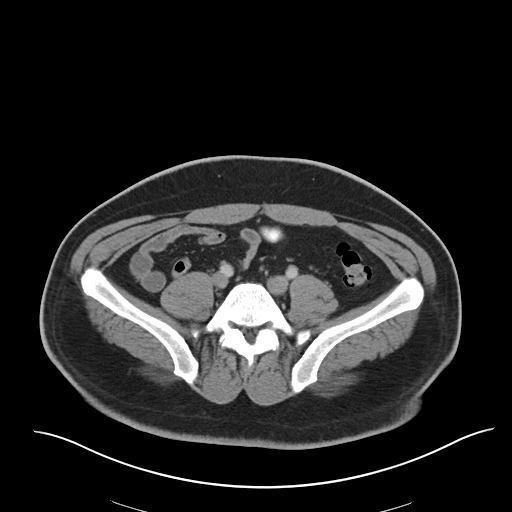
[im 55/123  soft-tissue]
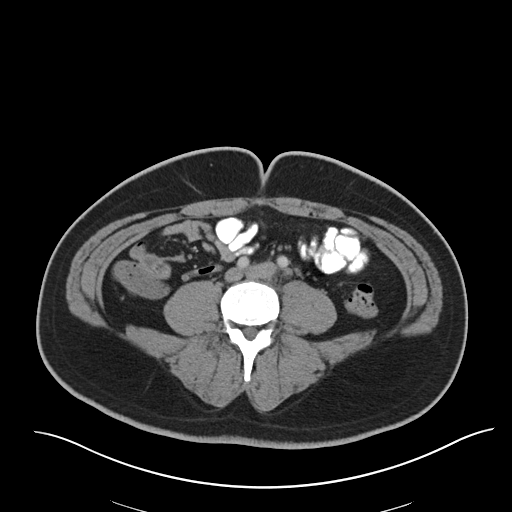
[im 68/123  soft-tissue]
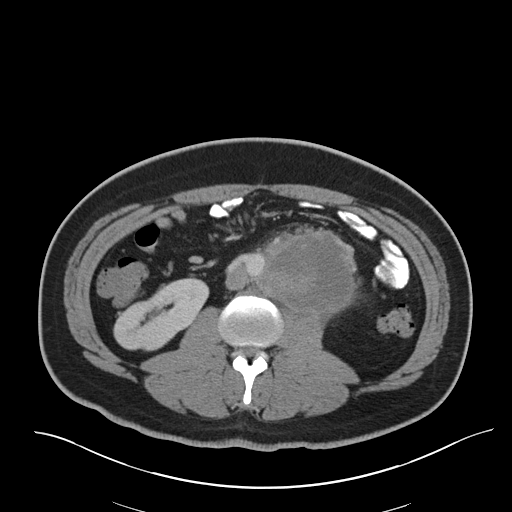
[im 75/123  soft-tissue]
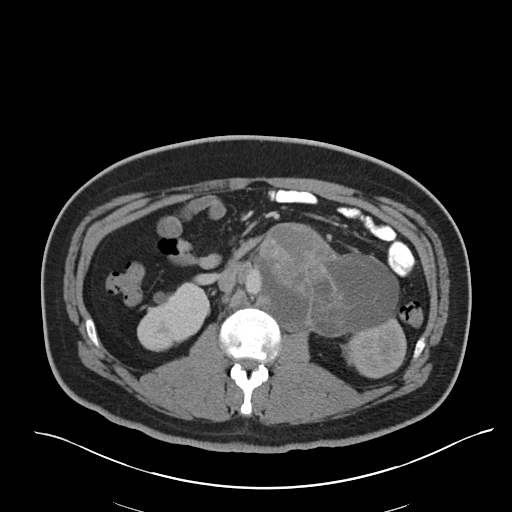
[im 89/123  soft-tissue]
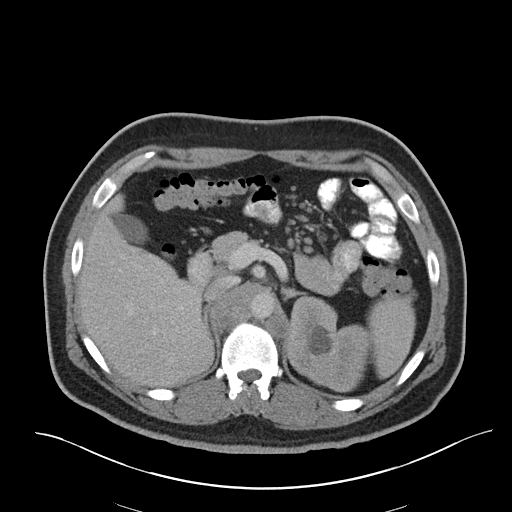
[im 89/123  bone]
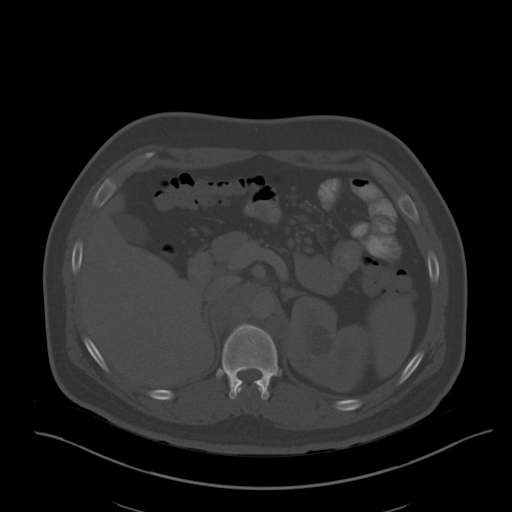
[im 95/123  soft-tissue]
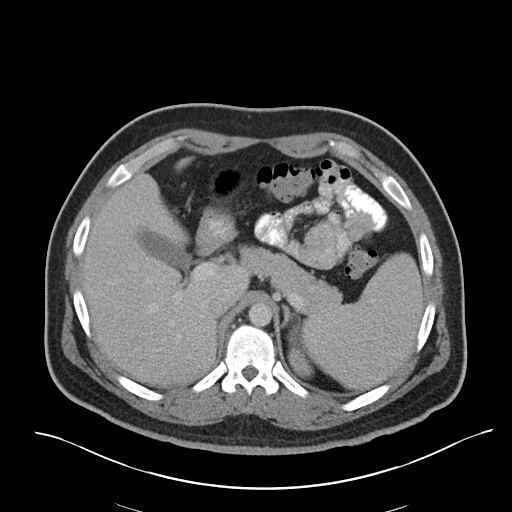
[im 102/123  soft-tissue]
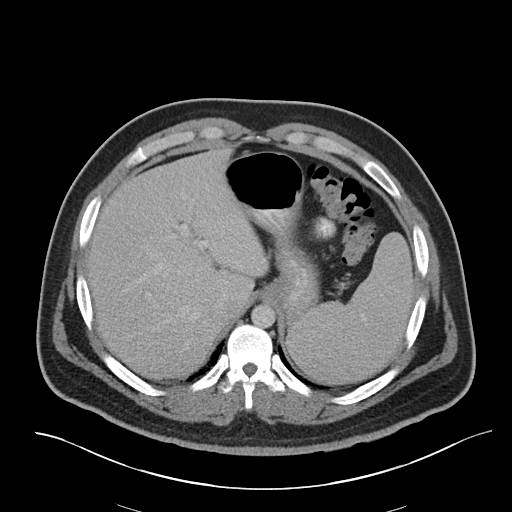
[im 116/123  soft-tissue]
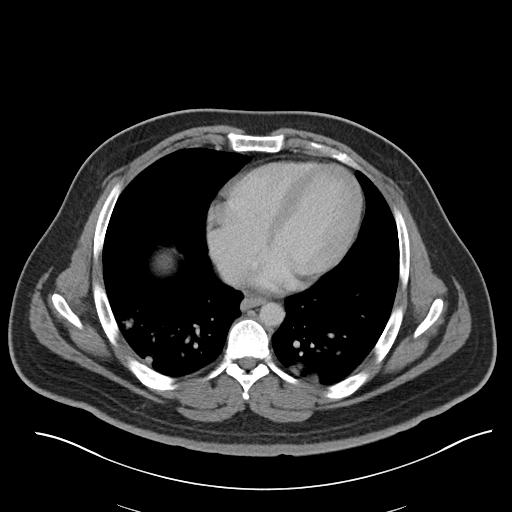

[Series 5: coronals · coronal · 0.94mm/px · 3 of 143 slices shown]
[im 48/143  soft-tissue]
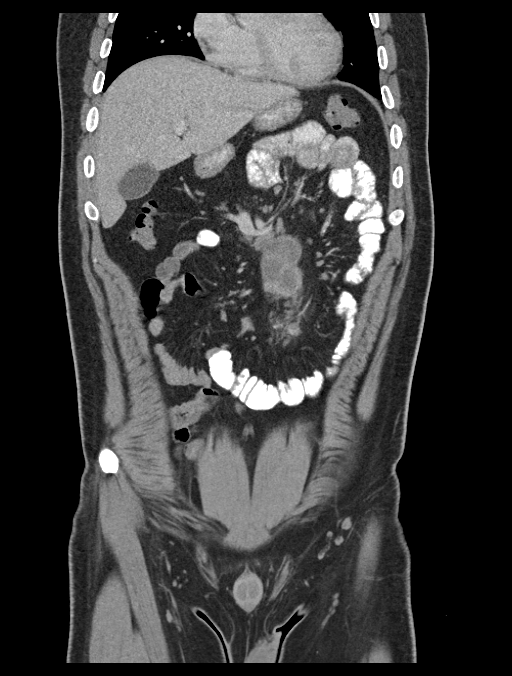
[im 64/143  soft-tissue]
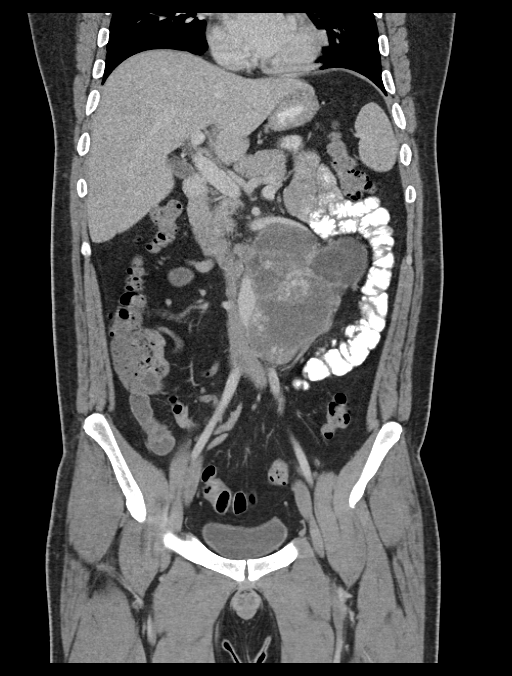
[im 79/143  soft-tissue]
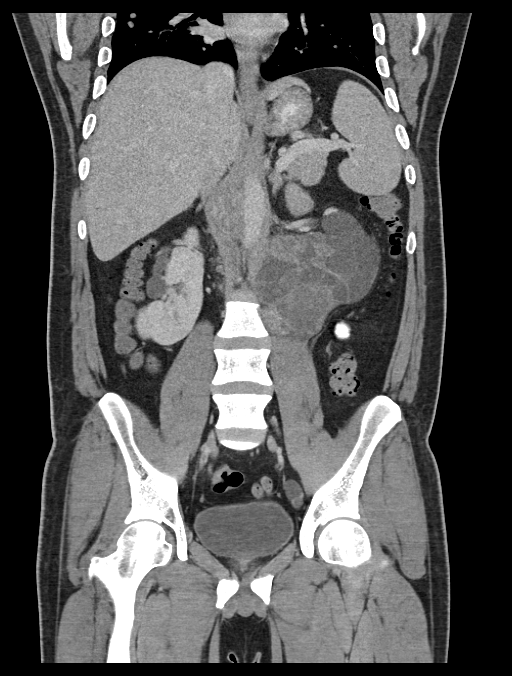

[15 of 46 positions shown; findings below may reference images not displayed]

FINDINGS: Lower chest: Bibasilar pulmonary nodules/metastasis. 1.6 cm left
lower lobe nodule on image 9 of series 3. Bilateral gynecomastia.
Normal heart size without pericardial or pleural effusion.

Hepatobiliary: Mildly enlarged caudate lobe. posterior lateral
segment left liver lobe 12 mm low-density lesion on image 25 of
series 2. Normal gallbladder, without biliary ductal dilatation.

Pancreas: Normal, without mass or ductal dilatation.

Spleen: Normal in size, without focal abnormality.

Adrenals/Urinary Tract: Normal adrenal glands. Malrotated right
kidney with right extrarenal pelvis. Moderate left-sided
hydronephrosis with decreased left renal function. This is secondary
to retroperitoneal mass detailed below. Distal ureters are normal in
caliber. Normal urinary bladder.

Stomach/Bowel: Normal stomach, without wall thickening. Normal
colon, appendix, and terminal ileum. Normal small bowel.

Vascular/Lymphatic: Normal caliber of the aorta and branch vessels.
Retroperitoneal heterogeneous mass is likely a nodal conglomerate.
9.4 x 11.9 cm, including on image 51 of series 2. This extends into
the aortocaval space. There is also bilateral retrocrural
adenopathy, including a heterogeneous 3.2 x 5.0 cm mass on image 36
of series 2.

Left external iliac adenopathy at 1.5 cm on image 94 of series 2

Reproductive: Normal prostate. left testicular hypo attenuating
nodule measures 9 mm on image 121 of series 2.

Other: No significant free fluid.

Musculoskeletal: No acute osseous abnormality. Congenitally short
lumbar pedicles.
IMPRESSION: 1. Extensive metastatic disease, including pulmonary nodules,
heterogeneous retroperitoneal adenopathy, retrocrural adenopathy and
left pelvic sidewall adenopathy. The distribution is most consistent
with A left testicular primary. Retroperitoneal sarcoma could look
similar but is felt less likely.
2. 9 mm left testicular lesion could represent the primary.
Recommend correlation with testicular ultrasound.
3. Left-sided hydronephrosis and decreased renal function, secondary
to the dominant retroperitoneal mass.
4. Bilateral gynecomastia.
These results were called by telephone at the time of interpretation
on 03/17/2015 at [DATE] to Dr. HOISENG KATORSE , who verbally
acknowledged these results.

## 2017-02-26 IMAGING — US US ART/VEN ABD/PELV/SCROTUM DOPPLER LTD
1 series · 13 of 25 positions shown · non-contrast
Comparison: CT 03/17/2015.

CLINICAL DATA: 29-year-old with pulmonary nodules and extensive
retroperitoneal lymphadenopathy. Possible left testicular primary on
CT. Initial encounter.

EXAM:
ULTRASOUND OF SCROTUM
TECHNIQUE: Complete ultrasound examination of the testicles, epididymis, and
other scrotal structures was performed.

[Series 1: us art/ven abd/pelv/scrotum doppler ltd · 0.05mm/px · 13 of 78 slices shown]
[im 1/78]
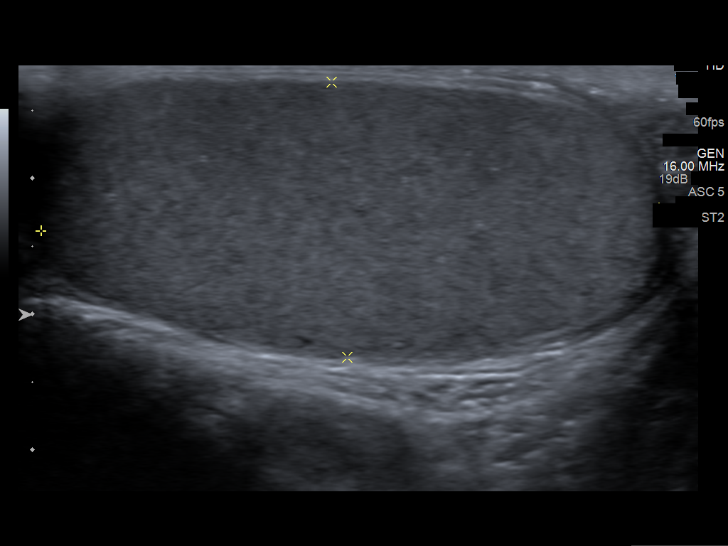
[im 7/78]
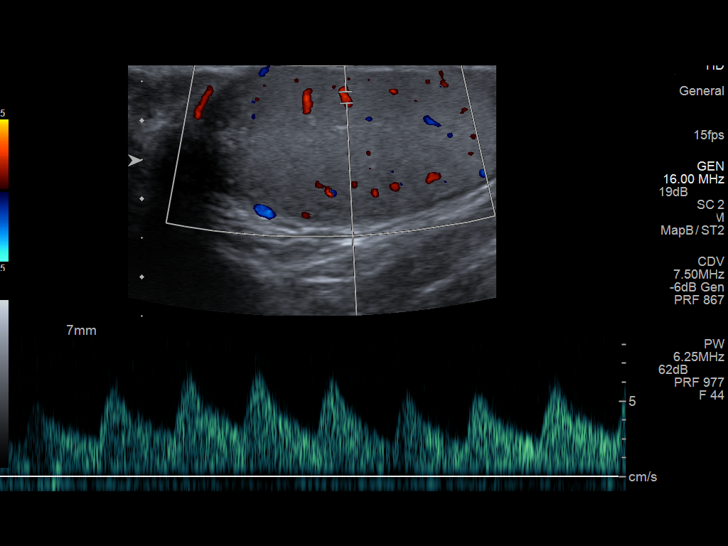
[im 13/78]
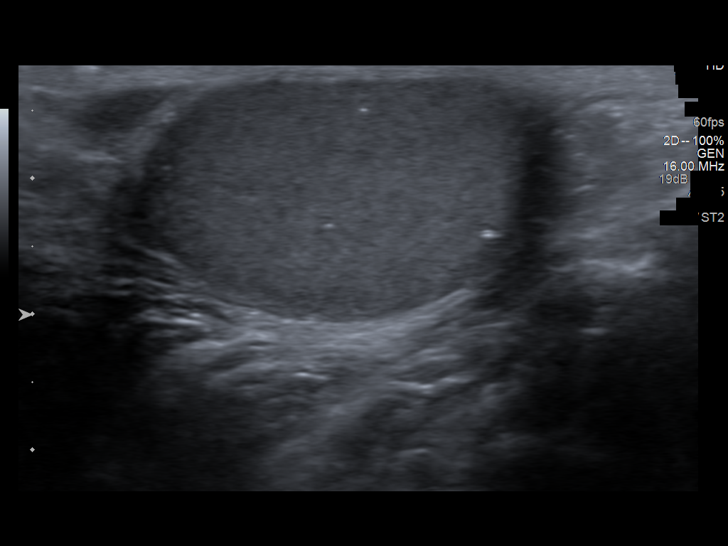
[im 20/78]
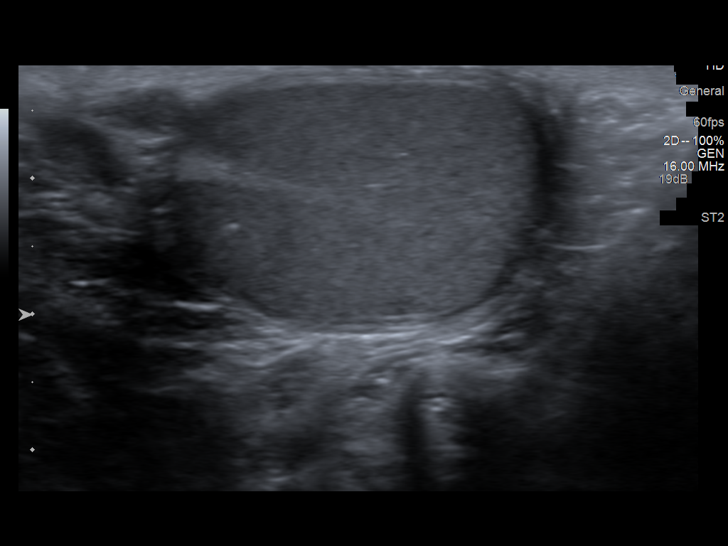
[im 26/78]
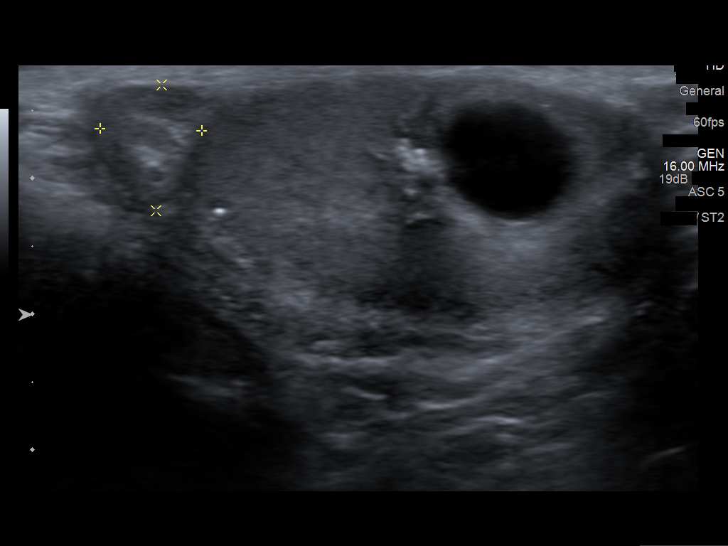
[im 33/78]
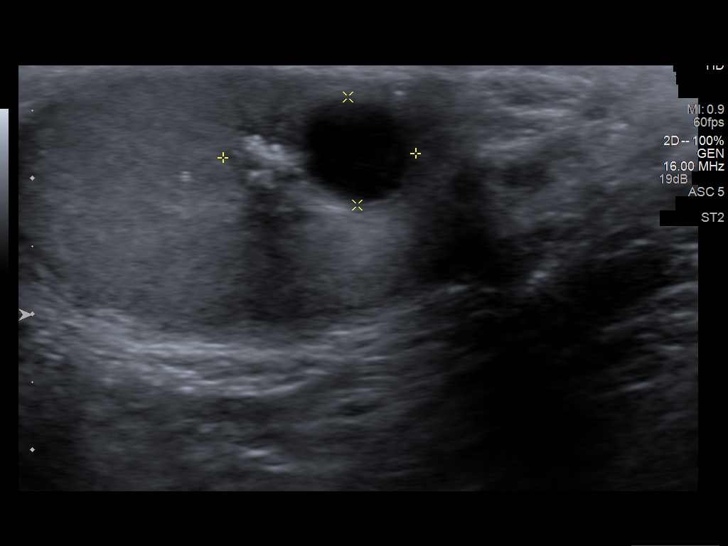
[im 39/78]
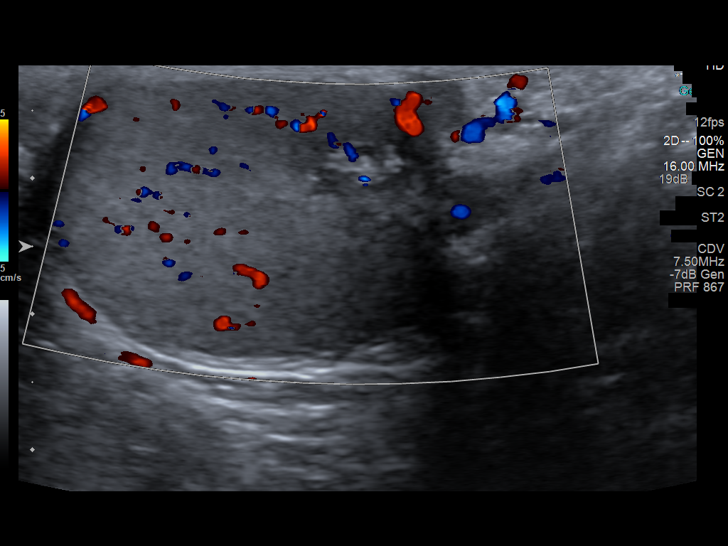
[im 45/78]
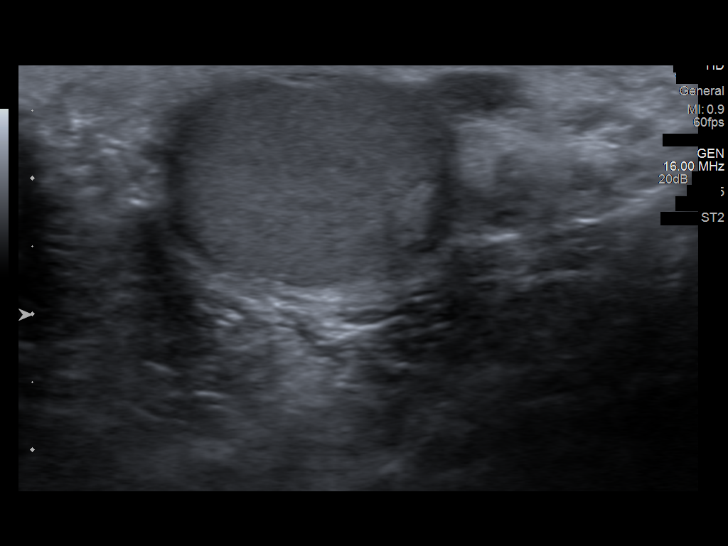
[im 52/78]
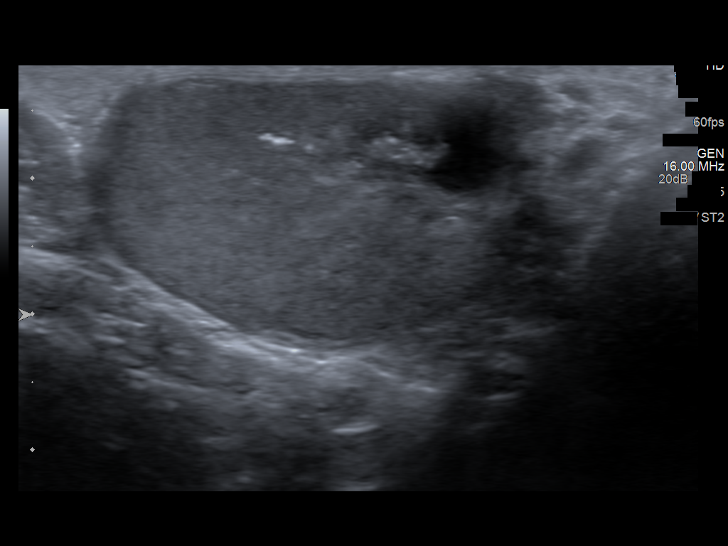
[im 58/78]
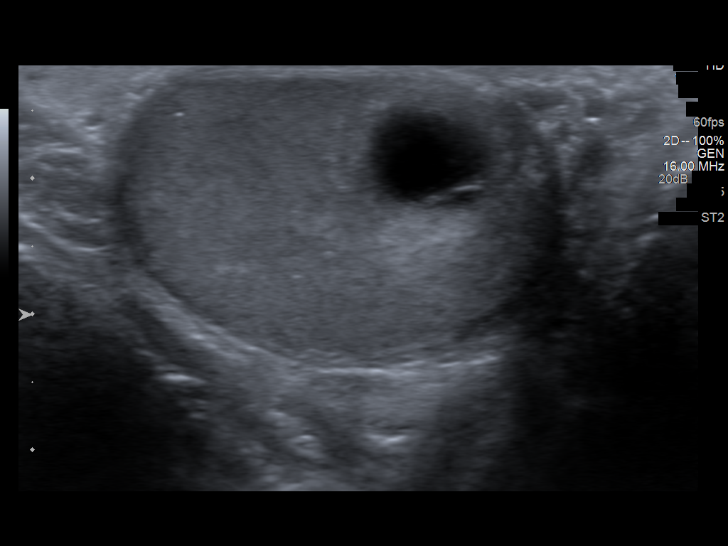
[im 65/78]
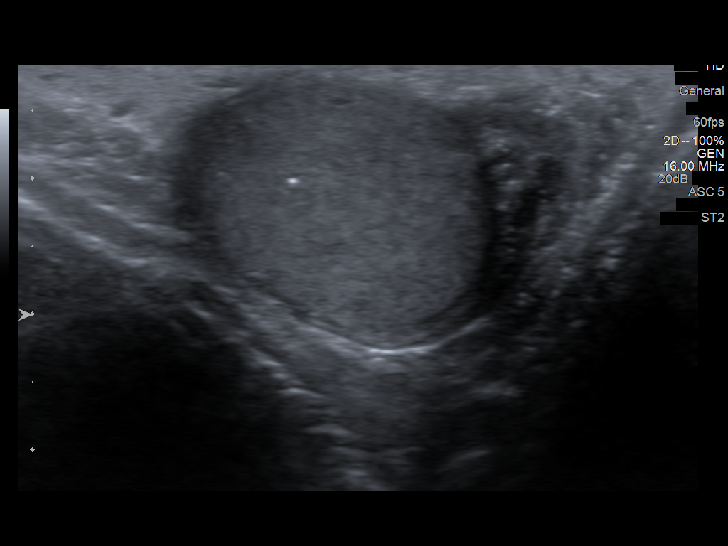
[im 71/78]
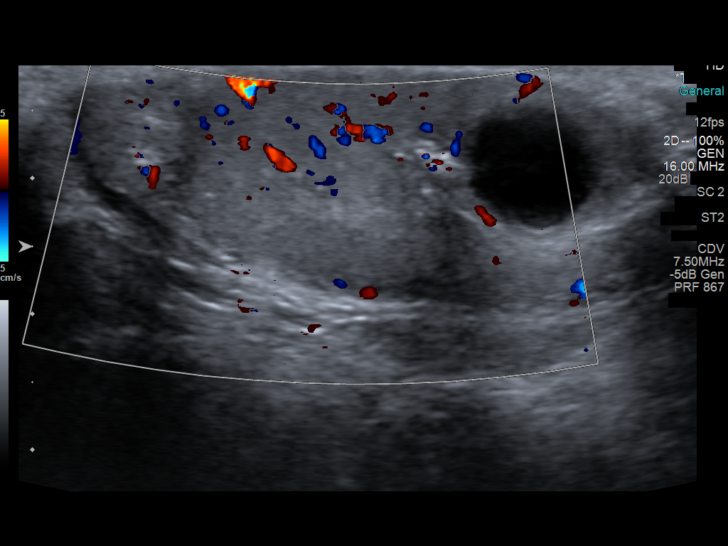
[im 78/78]
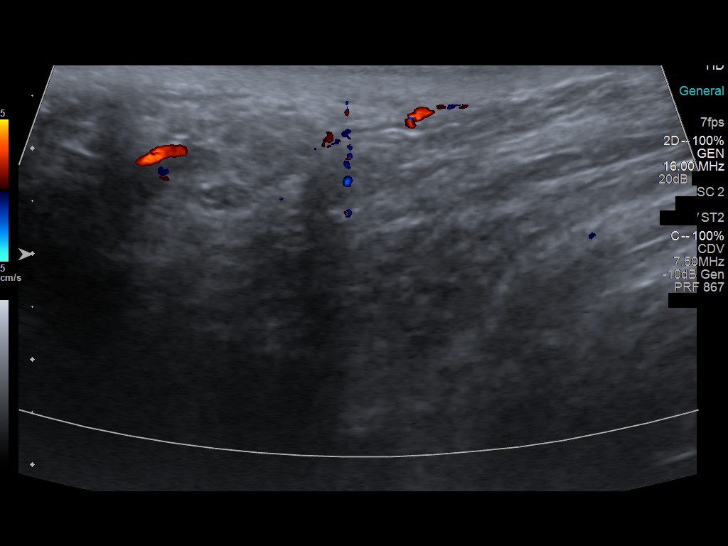

[13 of 25 positions shown; findings below may reference images not displayed]

FINDINGS: Right testicle

Measurements: 4.6 x 2.0 x 3.0 cm. Testicular microlithiasis noted.
No evidence of testicular mass or abnormal blood flow.

Left testicle

Measurements: 4.5 x 2.2 x 3.2 cm. There is a complex solid and
cystic lesion within the left testis with overall dimensions of
x 0.8 x 1.3 cm. The solid components are echogenic. Microlithiasis
noted.

Right epididymis:  Normal in size and appearance.

Left epididymis:  Normal in size and appearance.

Hydrocele:  Small right-sided hydrocele.

Varicocele:  None visualized.
IMPRESSION: 1. Complex solid and cystic left testicular lesion suspicious for a
burned-out germ-cell tumor in this young patient with extensive
retroperitoneal lymphadenopathy and pulmonary metastases.
2. Urology consultation, tissue sampling and correlation with serum
markers recommended.
